# Patient Record
Sex: Female | Born: 1975 | Race: White | Hispanic: No | Smoking: Current every day smoker
Health system: Southern US, Community
[De-identification: ages and names within clinical notes are randomized; demographics above are authoritative.]

## PROBLEM LIST (undated history)

## (undated) DIAGNOSIS — D649 Anemia, unspecified: Secondary | ICD-10-CM

## (undated) DIAGNOSIS — F329 Major depressive disorder, single episode, unspecified: Secondary | ICD-10-CM

## (undated) DIAGNOSIS — M1990 Unspecified osteoarthritis, unspecified site: Secondary | ICD-10-CM

## (undated) DIAGNOSIS — IMO0002 Reserved for concepts with insufficient information to code with codable children: Secondary | ICD-10-CM

## (undated) DIAGNOSIS — K635 Polyp of colon: Secondary | ICD-10-CM

## (undated) DIAGNOSIS — K589 Irritable bowel syndrome without diarrhea: Secondary | ICD-10-CM

## (undated) DIAGNOSIS — N83209 Unspecified ovarian cyst, unspecified side: Secondary | ICD-10-CM

## (undated) DIAGNOSIS — M329 Systemic lupus erythematosus, unspecified: Secondary | ICD-10-CM

## (undated) DIAGNOSIS — F32A Depression, unspecified: Secondary | ICD-10-CM

## (undated) DIAGNOSIS — F419 Anxiety disorder, unspecified: Secondary | ICD-10-CM

## (undated) HISTORY — DX: Depression, unspecified: F32.A

## (undated) HISTORY — DX: Major depressive disorder, single episode, unspecified: F32.9

## (undated) HISTORY — PX: COLONOSCOPY: SHX174

## (undated) HISTORY — DX: Irritable bowel syndrome, unspecified: K58.9

## (undated) HISTORY — DX: Reserved for concepts with insufficient information to code with codable children: IMO0002

## (undated) HISTORY — DX: Anxiety disorder, unspecified: F41.9

## (undated) HISTORY — DX: Polyp of colon: K63.5

## (undated) HISTORY — DX: Anemia, unspecified: D64.9

## (undated) HISTORY — DX: Unspecified osteoarthritis, unspecified site: M19.90

## (undated) HISTORY — DX: Systemic lupus erythematosus, unspecified: M32.9

## (undated) HISTORY — DX: Unspecified ovarian cyst, unspecified side: N83.209

---

## 1993-12-22 HISTORY — PX: DILATION AND CURETTAGE OF UTERUS: SHX78

## 2005-07-26 ENCOUNTER — Emergency Department (HOSPITAL_COMMUNITY): Admission: EM | Admit: 2005-07-26 | Discharge: 2005-07-26 | Payer: Self-pay | Admitting: Emergency Medicine

## 2006-04-22 ENCOUNTER — Emergency Department: Payer: Self-pay | Admitting: Emergency Medicine

## 2006-11-01 ENCOUNTER — Emergency Department: Payer: Self-pay | Admitting: Unknown Physician Specialty

## 2008-01-02 ENCOUNTER — Inpatient Hospital Stay (HOSPITAL_COMMUNITY): Admission: AD | Admit: 2008-01-02 | Discharge: 2008-01-03 | Payer: Self-pay | Admitting: Obstetrics and Gynecology

## 2008-01-05 ENCOUNTER — Encounter (HOSPITAL_COMMUNITY): Admission: RE | Admit: 2008-01-05 | Discharge: 2008-02-04 | Payer: Self-pay | Admitting: Obstetrics and Gynecology

## 2008-02-05 ENCOUNTER — Encounter: Admission: RE | Admit: 2008-02-05 | Discharge: 2008-02-15 | Payer: Self-pay | Admitting: Obstetrics and Gynecology

## 2008-03-26 ENCOUNTER — Emergency Department: Payer: Self-pay | Admitting: Internal Medicine

## 2010-07-15 ENCOUNTER — Emergency Department: Payer: Self-pay | Admitting: Internal Medicine

## 2011-09-11 LAB — CBC
HCT: 31.3 — ABNORMAL LOW
HCT: 33 — ABNORMAL LOW
Hemoglobin: 11.6 — ABNORMAL LOW
MCHC: 34.9
MCV: 90.2
RBC: 3.47 — ABNORMAL LOW
RBC: 3.71 — ABNORMAL LOW
WBC: 13.1 — ABNORMAL HIGH

## 2011-11-22 ENCOUNTER — Encounter: Payer: Self-pay | Admitting: *Deleted

## 2011-11-22 ENCOUNTER — Emergency Department (HOSPITAL_COMMUNITY)
Admission: EM | Admit: 2011-11-22 | Discharge: 2011-11-22 | Disposition: A | Discharge status: Home / Self Care | Payer: Self-pay | Source: Home / Self Care

## 2011-11-22 DIAGNOSIS — B349 Viral infection, unspecified: Secondary | ICD-10-CM

## 2011-11-22 DIAGNOSIS — H6692 Otitis media, unspecified, left ear: Secondary | ICD-10-CM

## 2011-11-22 DIAGNOSIS — H6690 Otitis media, unspecified, unspecified ear: Secondary | ICD-10-CM

## 2011-11-22 DIAGNOSIS — B9789 Other viral agents as the cause of diseases classified elsewhere: Secondary | ICD-10-CM

## 2011-11-22 MED ORDER — AMOXICILLIN 500 MG PO CAPS: ORAL_CAPSULE | ORAL | Status: DC

## 2011-11-22 MED ORDER — PROMETHAZINE-CODEINE 6.25-10 MG/5ML PO SYRP: ORAL_SOLUTION | ORAL | Status: AC

## 2011-11-22 NOTE — ED Notes (Signed)
Pt called in lobby x 1 

## 2011-11-22 NOTE — ED Provider Notes (Signed)
Medical screening examination/treatment/procedure(s) were performed by non-physician practitioner and as supervising physician I was immediately available for consultation/collaboration.  LANEY,RONNIE   Ronnie Laney, MD 11/22/11 2222 

## 2011-11-22 NOTE — ED Provider Notes (Signed)
History     CSN: [id number] Arrival date & time: 11/22/2011  5:39 PM   None     Chief Complaint  Patient presents with  . Nasal Congestion    onset of symptoms x 3 days - last night fever 103 - pain with coughing bilateral upper abd   . Cough  . Otalgia  . Generalized Body Aches    (Consider location/radiation/quality/duration/timing/severity/associated sxs/prior treatment) HPI Comments: Onset of cough, fever, and body aches 3 days ago. Has been taking Robitussin without improvement. Pain lower ribs/upper abdomen area with cough and deep breath. Lt ear began hurting today. Has not taken anything for fever or pain.   Patient is a 35 y.o. female presenting with cough and ear pain. The history is provided by the patient.  Cough This is a new problem. The current episode started more than 2 days ago. The problem occurs every few minutes. The problem has not changed since onset.The cough is productive of sputum. The maximum temperature recorded prior to her arrival was 103 to 104 F. Associated symptoms include chest pain (bilat lower ribs with cough and deep breath), chills, ear pain, rhinorrhea, sore throat and myalgias. Pertinent negatives include no headaches, no shortness of breath and no wheezing. She has tried cough syrup for the symptoms. The treatment provided no relief. Her past medical history does not include asthma.  Otalgia This is a new problem. The current episode started 12 to 24 hours ago. There is pain in the left ear. The problem occurs constantly. The problem has not changed since onset.The pain is moderate. Associated symptoms include rhinorrhea, sore throat and cough. Pertinent negatives include no headaches. Her past medical history does not include chronic ear infection.    History reviewed. No pertinent past medical history.  History reviewed. No pertinent past surgical history.  History reviewed. No pertinent family history.  History  Substance Use Topics  .  Smoking status: Current Everyday Smoker  . Smokeless tobacco: Not on file  . Alcohol Use: Yes    OB History    Grav Para Term Preterm Abortions TAB SAB Ect Mult Living                  Review of Systems  Constitutional: Positive for chills.  HENT: Positive for ear pain, sore throat and rhinorrhea.   Respiratory: Positive for cough. Negative for shortness of breath and wheezing.   Cardiovascular: Positive for chest pain (bilat lower ribs with cough and deep breath).  Musculoskeletal: Positive for myalgias.  Neurological: Negative for headaches.    Allergies  Review of patient's allergies indicates no known allergies.  Home Medications   Current Outpatient Rx  Name Route Sig Dispense Refill  . GUAIFENESIN 100 MG/5ML PO LIQD Oral Take 200 mg by mouth 3 (three) times daily as needed.      . AMOXICILLIN 500 MG PO CAPS  1 cap tid x 10d 30 capsule 0  . PROMETHAZINE-CODEINE 6.25-10 MG/5ML PO SYRP  1-2 tsp every 6 hrs prn cough 120 mL 0    BP 103/70  Pulse 97  Temp(Src) 99.3 F (37.4 C) (Oral)  Resp 18  SpO2 97%  LMP 10/15/2011  Physical Exam  Nursing note and vitals reviewed. Constitutional: She appears well-developed and well-nourished. No distress.  HENT:  Head: Normocephalic and atraumatic.  Right Ear: Tympanic membrane, external ear and ear canal normal.  Left Ear: External ear and ear canal normal. Tympanic membrane is erythematous (and dull).  Nose: Nose normal.  Mouth/Throat: Uvula is midline, oropharynx is clear and moist and mucous membranes are normal. No oropharyngeal exudate, posterior oropharyngeal edema or posterior oropharyngeal erythema.  Neck: Neck supple.  Cardiovascular: Normal rate, regular rhythm and normal heart sounds.   Pulmonary/Chest: Effort normal and breath sounds normal. No respiratory distress.  Lymphadenopathy:    She has no cervical adenopathy.  Neurological: She is alert.  Skin: Skin is warm and dry.  Psychiatric: She has a normal mood  and affect.    ED Course  Procedures (including critical care time)   Labs Reviewed  POCT PREGNANCY, URINE   No results found.   1. Acute otitis media, left   2. Viral infection       MDM          Melody Comas, PA 11/22/11 1813

## 2015-03-29 ENCOUNTER — Other Ambulatory Visit: Payer: Self-pay

## 2015-03-29 ENCOUNTER — Other Ambulatory Visit: Payer: Self-pay | Admitting: Obstetrics & Gynecology

## 2015-03-29 ENCOUNTER — Other Ambulatory Visit (HOSPITAL_COMMUNITY)
Admission: RE | Admit: 2015-03-29 | Discharge: 2015-03-29 | Disposition: A | Discharge status: Home / Self Care | Payer: Managed Care, Other (non HMO) | Source: Ambulatory Visit | Attending: Obstetrics & Gynecology | Admitting: Obstetrics & Gynecology

## 2015-03-29 DIAGNOSIS — Z1151 Encounter for screening for human papillomavirus (HPV): Secondary | ICD-10-CM | POA: Insufficient documentation

## 2015-03-29 DIAGNOSIS — Z113 Encounter for screening for infections with a predominantly sexual mode of transmission: Secondary | ICD-10-CM | POA: Insufficient documentation

## 2015-03-29 DIAGNOSIS — Z01419 Encounter for gynecological examination (general) (routine) without abnormal findings: Secondary | ICD-10-CM | POA: Diagnosis not present

## 2015-03-29 DIAGNOSIS — Z1231 Encounter for screening mammogram for malignant neoplasm of breast: Secondary | ICD-10-CM

## 2015-03-30 LAB — CYTOLOGY - PAP

## 2015-04-04 ENCOUNTER — Ambulatory Visit
Admission: RE | Admit: 2015-04-04 | Discharge: 2015-04-04 | Disposition: A | Discharge status: Home / Self Care | Payer: Managed Care, Other (non HMO) | Source: Ambulatory Visit

## 2015-04-04 DIAGNOSIS — Z1231 Encounter for screening mammogram for malignant neoplasm of breast: Secondary | ICD-10-CM

## 2015-05-25 ENCOUNTER — Ambulatory Visit (INDEPENDENT_AMBULATORY_CARE_PROVIDER_SITE_OTHER): Payer: Managed Care, Other (non HMO) | Admitting: Medical

## 2015-05-25 ENCOUNTER — Encounter: Payer: Self-pay | Admitting: Medical

## 2015-05-25 ENCOUNTER — Ambulatory Visit (HOSPITAL_BASED_OUTPATIENT_CLINIC_OR_DEPARTMENT_OTHER)
Admission: RE | Admit: 2015-05-25 | Discharge: 2015-05-25 | Disposition: A | Discharge status: Home / Self Care | Payer: Managed Care, Other (non HMO) | Source: Ambulatory Visit | Attending: Medical | Admitting: Medical

## 2015-05-25 VITALS — BP 105/74 | HR 87 | Temp 98.9°F | Ht 69.0 in | Wt 169.0 lb

## 2015-05-25 DIAGNOSIS — D649 Anemia, unspecified: Secondary | ICD-10-CM

## 2015-05-25 DIAGNOSIS — S0093XA Contusion of unspecified part of head, initial encounter: Secondary | ICD-10-CM

## 2015-05-25 DIAGNOSIS — M898X1 Other specified disorders of bone, shoulder: Secondary | ICD-10-CM

## 2015-05-25 DIAGNOSIS — R0781 Pleurodynia: Secondary | ICD-10-CM | POA: Insufficient documentation

## 2015-05-25 DIAGNOSIS — J3489 Other specified disorders of nose and nasal sinuses: Secondary | ICD-10-CM | POA: Insufficient documentation

## 2015-05-25 DIAGNOSIS — M25512 Pain in left shoulder: Secondary | ICD-10-CM | POA: Diagnosis present

## 2015-05-25 DIAGNOSIS — R0789 Other chest pain: Secondary | ICD-10-CM

## 2015-05-25 LAB — CBC WITH DIFFERENTIAL/PLATELET
BASOS PCT: 0.7 % (ref 0.0–3.0)
Basophils Absolute: 0 10*3/uL (ref 0.0–0.1)
EOS PCT: 3.6 % (ref 0.0–5.0)
Eosinophils Absolute: 0.1 10*3/uL (ref 0.0–0.7)
HEMATOCRIT: 37.4 % (ref 36.0–46.0)
Hemoglobin: 12.6 g/dL (ref 12.0–15.0)
LYMPHS ABS: 1.6 10*3/uL (ref 0.7–4.0)
LYMPHS PCT: 41.6 % (ref 12.0–46.0)
MCHC: 33.5 g/dL (ref 30.0–36.0)
MCV: 91.2 fl (ref 78.0–100.0)
Monocytes Absolute: 0.3 10*3/uL (ref 0.1–1.0)
Monocytes Relative: 7 % (ref 3.0–12.0)
NEUTROS ABS: 1.8 10*3/uL (ref 1.4–7.7)
Neutrophils Relative %: 47.1 % (ref 43.0–77.0)
PLATELETS: 162 10*3/uL (ref 150.0–400.0)
RBC: 4.1 Mil/uL (ref 3.87–5.11)
RDW: 13.7 % (ref 11.5–15.5)
WBC: 3.9 10*3/uL — ABNORMAL LOW (ref 4.0–10.5)

## 2015-05-25 MED ORDER — DICLOFENAC SODIUM 75 MG PO TBEC: 75.0000 mg | DELAYED_RELEASE_TABLET | Freq: Two times a day (BID) | ORAL | Status: DC

## 2015-05-25 MED ORDER — CYCLOBENZAPRINE HCL 10 MG PO TABS: 10.0000 mg | ORAL_TABLET | Freq: Every day | ORAL | Status: DC
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
## 2015-05-25 NOTE — Progress Notes (Signed)
Pre visit review using our clinic review tool, if applicable. No additional management support is needed unless otherwise documented below in the visit note. 
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
## 2015-05-25 NOTE — Assessment & Plan Note (Signed)
Some pain behind rt ear. Mild and no loc during attack. I do not think imaging of head indicated but if you get neurologic signs or symptoms later then ED evaluation.

## 2015-05-25 NOTE — Assessment & Plan Note (Addendum)
Will get cxr and lt rib series. rx diclofenac and flexeril.

## 2015-05-26 ENCOUNTER — Encounter: Payer: Self-pay | Admitting: Medical

## 2015-05-28 ENCOUNTER — Telehealth: Payer: Self-pay | Admitting: Medical

## 2015-05-28 NOTE — Telephone Encounter (Signed)
Called patient with lab results.  

## 2015-05-28 NOTE — Telephone Encounter (Signed)
Caller name:Ovie Tigert Relationship to patient:self Can be reached:[phone]   Reason for call: Pt calling in reference to results from blood work and xrays on Friday- has sent a message through my chart on 05/26/15 as well.

## 2015-10-16 ENCOUNTER — Encounter: Payer: Self-pay | Admitting: Physician Assistant

## 2015-10-16 ENCOUNTER — Ambulatory Visit (INDEPENDENT_AMBULATORY_CARE_PROVIDER_SITE_OTHER): Payer: Managed Care, Other (non HMO) | Admitting: Physician Assistant

## 2015-10-16 ENCOUNTER — Ambulatory Visit (HOSPITAL_BASED_OUTPATIENT_CLINIC_OR_DEPARTMENT_OTHER)
Admission: RE | Admit: 2015-10-16 | Discharge: 2015-10-16 | Disposition: A | Discharge status: Home / Self Care | Payer: Managed Care, Other (non HMO) | Source: Ambulatory Visit | Attending: Physician Assistant | Admitting: Physician Assistant

## 2015-10-16 VITALS — BP 120/76 | HR 55 | Temp 98.3°F | Resp 16 | Ht 69.0 in | Wt 170.0 lb

## 2015-10-16 DIAGNOSIS — K2990 Gastroduodenitis, unspecified, without bleeding: Secondary | ICD-10-CM | POA: Diagnosis not present

## 2015-10-16 DIAGNOSIS — N83202 Unspecified ovarian cyst, left side: Secondary | ICD-10-CM | POA: Insufficient documentation

## 2015-10-16 DIAGNOSIS — R109 Unspecified abdominal pain: Secondary | ICD-10-CM | POA: Insufficient documentation

## 2015-10-16 DIAGNOSIS — K2970 Gastritis, unspecified, without bleeding: Secondary | ICD-10-CM | POA: Diagnosis not present

## 2015-10-16 LAB — POCT URINALYSIS DIPSTICK
BILIRUBIN UA: NEGATIVE
Blood, UA: NEGATIVE
GLUCOSE UA: NEGATIVE
KETONES UA: NEGATIVE
Leukocytes, UA: NEGATIVE
Nitrite, UA: NEGATIVE
Protein, UA: NEGATIVE
SPEC GRAV UA: 1.015
Urobilinogen, UA: 0.2
pH, UA: 6.5

## 2015-10-16 LAB — POCT URINE PREGNANCY: Preg Test, Ur: NEGATIVE

## 2015-10-16 MED ORDER — IOHEXOL 300 MG/ML  SOLN
100.0000 mL | Freq: Once | INTRAMUSCULAR | Status: AC | PRN
  Administered 2015-10-16: 100 mL via INTRAVENOUS

## 2015-10-16 MED ORDER — OMEPRAZOLE 20 MG PO CPDR: 20.0000 mg | DELAYED_RELEASE_CAPSULE | Freq: Two times a day (BID) | ORAL | Status: DC

## 2015-10-16 NOTE — Progress Notes (Signed)
Pre visit review using our clinic review tool, if applicable. No additional management support is needed unless otherwise documented below in the visit note/SLS  

## 2015-10-16 NOTE — Assessment & Plan Note (Signed)
Urine dip and urine pregnancy negative. Suspect gastritis due to recent increase in alcohol and NSAID use. Denies symptoms of rectal bleeding. Will begin Prilosec 20 mg BID. Will check CBC, CMP, Lipase, H. Pylori and CT abdomen. No alcohol or NSAIDs. Giving chronicity of symptoms prior to exacerbation, referral to GI placed for assessment and endoscopy/colonoscopy.

## 2015-10-16 NOTE — Patient Instructions (Signed)
Please go to the lab for blood work. Then stop by front desk to schedule CT scan with Marj.  Take the Prilosec twice daily as directed. Stay hydrated and follow diet recommendations below. No anti-inflammatories or alcohol. We will alter regimen based on results.

## 2015-10-16 NOTE — Progress Notes (Signed)
Patient presents to clinic today c/o 2 weeks of exacerbation of chronic LUQ pain associated with emesis and nausea. Denies fever, chills. Endorses diarrhea but denies melena or hematochezia. Denies recent travel or sick contact. Denies radiation of pain. Has had negative workup for chronic LUQ pain prior with imaging and labs. Was told she needed endoscopy. Does not increased alcohol consumption and daily NSAID use.  Past Medical History  Diagnosis Date  . Anemia   . Anxiety     No current outpatient prescriptions on file prior to visit.   No current facility-administered medications on file prior to visit.    No Known Allergies  Family History  Problem Relation Age of Onset  . Thrombosis Father     Social History   Social History  . Marital Status: Divorced    Spouse Name: N/A  . Number of Children: N/A  . Years of Education: N/A   Social History Main Topics  . Smoking status: Current Every Day Smoker  . Smokeless tobacco: None  . Alcohol Use: Yes  . Drug Use: No  . Sexual Activity: Not Asked   Other Topics Concern  . None   Social History Narrative   Review of Systems - See HPI.  All other ROS are negative.  BP 120/76 mmHg  Pulse 55  Temp(Src) 98.3 F (36.8 C) (Oral)  Resp 16  Ht 5\' 9"  (1.753 m)  Wt 170 lb (77.111 kg)  BMI 25.09 kg/m2  SpO2 100%  LMP 09/25/2015  Physical Exam  Constitutional: She is oriented to person, place, and time and well-developed, well-nourished, and in no distress.  HENT:  Head: Normocephalic and atraumatic.  Eyes: Conjunctivae are normal.  Neck: Neck supple.  Cardiovascular: Normal rate, regular rhythm, normal heart sounds and intact distal pulses.   Pulmonary/Chest: Effort normal and breath sounds normal. No respiratory distress. She has no wheezes. She has no rales. She exhibits no tenderness.  Abdominal: Soft. Normal appearance and bowel sounds are normal. There is no hepatosplenomegaly. There is tenderness in the left  upper quadrant. There is no rebound, no CVA tenderness, no tenderness at McBurney's point and negative Murphy's sign. No hernia.  Neurological: She is alert and oriented to person, place, and time.  Skin: Skin is warm and dry. No rash noted.  Psychiatric: Affect normal.  Vitals reviewed.  Recent Results (from the past 2160 hour(s))  POCT urinalysis dipstick     Status: None   Collection Time: 10/16/15  4:18 PM  Result Value Ref Range   Color, UA yellow    Clarity, UA clear    Glucose, UA neg    Bilirubin, UA neg    Ketones, UA neg    Spec Grav, UA 1.015    Blood, UA neg    pH, UA 6.5    Protein, UA neg    Urobilinogen, UA 0.2    Nitrite, UA neg    Leukocytes, UA Negative Negative  POCT urine pregnancy     Status: None   Collection Time: 10/16/15  4:20 PM  Result Value Ref Range   Preg Test, Ur Negative Negative    Assessment/Plan: Abdominal pain in female Urine dip and urine pregnancy negative. Suspect gastritis due to recent increase in alcohol and NSAID use. Denies symptoms of rectal bleeding. Will begin Prilosec 20 mg BID. Will check CBC, CMP, Lipase, H. Pylori and CT abdomen. No alcohol or NSAIDs. Giving chronicity of symptoms prior to exacerbation, referral to GI placed for assessment and endoscopy/colonoscopy.

## 2015-10-17 ENCOUNTER — Encounter: Payer: Self-pay | Admitting: Physician Assistant

## 2015-10-17 LAB — CBC WITH DIFFERENTIAL/PLATELET
BASOS PCT: 1.2 % (ref 0.0–3.0)
Basophils Absolute: 0.1 10*3/uL (ref 0.0–0.1)
EOS PCT: 3.9 % (ref 0.0–5.0)
Eosinophils Absolute: 0.2 10*3/uL (ref 0.0–0.7)
HEMATOCRIT: 33.7 % — AB (ref 36.0–46.0)
HEMOGLOBIN: 11.3 g/dL — AB (ref 12.0–15.0)
LYMPHS PCT: 42.7 % (ref 12.0–46.0)
Lymphs Abs: 1.9 10*3/uL (ref 0.7–4.0)
MCHC: 33.7 g/dL (ref 30.0–36.0)
MCV: 92.4 fl (ref 78.0–100.0)
MONOS PCT: 4.1 % (ref 3.0–12.0)
Monocytes Absolute: 0.2 10*3/uL (ref 0.1–1.0)
Neutro Abs: 2.1 10*3/uL (ref 1.4–7.7)
Neutrophils Relative %: 48.1 % (ref 43.0–77.0)
Platelets: 167 10*3/uL (ref 150.0–400.0)
RBC: 3.65 Mil/uL — AB (ref 3.87–5.11)
RDW: 13.7 % (ref 11.5–15.5)
WBC: 4.4 10*3/uL (ref 4.0–10.5)

## 2015-10-17 LAB — COMPREHENSIVE METABOLIC PANEL
ALBUMIN: 3.7 g/dL (ref 3.5–5.2)
ALT: 17 U/L (ref 0–35)
AST: 14 U/L (ref 0–37)
Alkaline Phosphatase: 64 U/L (ref 39–117)
BUN: 9 mg/dL (ref 6–23)
CALCIUM: 8.9 mg/dL (ref 8.4–10.5)
CHLORIDE: 108 meq/L (ref 96–112)
CO2: 27 meq/L (ref 19–32)
Creatinine, Ser: 0.72 mg/dL (ref 0.40–1.20)
GFR: 94.61 mL/min (ref >60.00)
Glucose, Bld: 94 mg/dL (ref 70–99)
POTASSIUM: 3.8 meq/L (ref 3.5–5.1)
Sodium: 142 mEq/L (ref 135–145)
Total Bilirubin: 0.3 mg/dL (ref 0.2–1.2)
Total Protein: 6.7 g/dL (ref 6.0–8.3)

## 2015-10-17 LAB — LIPASE: LIPASE: 36 U/L (ref 11.0–59.0)

## 2015-10-17 LAB — H. PYLORI ANTIBODY, IGG: H PYLORI IGG: NEGATIVE

## 2015-11-07 ENCOUNTER — Ambulatory Visit (INDEPENDENT_AMBULATORY_CARE_PROVIDER_SITE_OTHER): Payer: Managed Care, Other (non HMO) | Admitting: Physician Assistant

## 2015-11-07 ENCOUNTER — Other Ambulatory Visit (INDEPENDENT_AMBULATORY_CARE_PROVIDER_SITE_OTHER): Payer: Managed Care, Other (non HMO)

## 2015-11-07 ENCOUNTER — Encounter: Payer: Self-pay | Admitting: Physician Assistant

## 2015-11-07 ENCOUNTER — Other Ambulatory Visit: Payer: Self-pay | Admitting: *Deleted

## 2015-11-07 VITALS — BP 108/66 | HR 70 | Ht 69.0 in | Wt 168.0 lb

## 2015-11-07 DIAGNOSIS — R1032 Left lower quadrant pain: Secondary | ICD-10-CM | POA: Diagnosis not present

## 2015-11-07 DIAGNOSIS — E538 Deficiency of other specified B group vitamins: Secondary | ICD-10-CM

## 2015-11-07 DIAGNOSIS — R197 Diarrhea, unspecified: Secondary | ICD-10-CM

## 2015-11-07 DIAGNOSIS — R1012 Left upper quadrant pain: Secondary | ICD-10-CM | POA: Diagnosis not present

## 2015-11-07 DIAGNOSIS — R195 Other fecal abnormalities: Secondary | ICD-10-CM

## 2015-11-07 LAB — IBC PANEL
Iron: 79 ug/dL (ref 42–145)
SATURATION RATIOS: 23.4 % (ref 20.0–50.0)
TRANSFERRIN: 241 mg/dL (ref 212.0–360.0)

## 2015-11-07 LAB — HIGH SENSITIVITY CRP: CRP, High Sensitivity: 1.19 mg/L (ref 0.000–5.000)

## 2015-11-07 LAB — IGA: IgA: 490 mg/dL — ABNORMAL HIGH (ref 68–378)

## 2015-11-07 LAB — VITAMIN B12: Vitamin B-12: 170 pg/mL — ABNORMAL LOW (ref 211–911)

## 2015-11-07 LAB — FERRITIN: FERRITIN: 37 ng/mL (ref 10.0–291.0)

## 2015-11-07 MED ORDER — NA SULFATE-K SULFATE-MG SULF 17.5-3.13-1.6 GM/177ML PO SOLN: 1.0000 | Freq: Once | ORAL | Status: DC

## 2015-11-07 NOTE — Progress Notes (Signed)
Patient ID: Kelsey Bates, female   DOB: [birth year], 39 y.o.   MRN: [id number]    HPI:  Kelsey Bates is a 39 y.o.   female  referred by Brunetta Jeans, PA-C for evaluation of abdominal pain. She has past medical history of anemia, anxiety, depression, and irritable bowel syndrome. 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  Patient states she has had abdominal pain for 6 or 7 years. Her pain is intermittent and comes and goes. It is not alleviated or exacerbated with ingestion of food. It is not alleviated nor exacerbated with defecation. She reports that if she drinks a lot of coffee she will develop gurgling in the upper abdomen and begin to have cramping. She states that 2 weeks ago everyone in her household had a "stomach virus". Her family all felt better in 3 days but her symptoms were so severe she had difficulty standing for 2 weeks. She gets intermittent epigastric pain. She reports that every time she eats, she instantly has to have a bowel movement. She initially tried eliminating seeds and meat from her diet but this provided no help. Recently she eliminated beans and dairy and feels this has provided some relief. In the past she used to have a bowel movement every 3 days. For the past year she has 4 or 5 watery bowel movements daily. She has no mucus with her stools and has not noticed bright red blood. Over the past year she has also been having menstrual irregularities. She reports that she was admitted to [facility] 4 years ago and diagnosed with colitis. At that time she was sent home from the emergency room and advised to have a colonoscopy. Unfortunately, she never followed up with GI due to fear of a colonoscopy. 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 It was during that time that she developed diarrhea. She was never hospitalized for her toxic shock syndrome but was treated by her  gynecologist. She denies a history of PID. She often uses Aleve or Advil for her pain but recently discontinued this about 2 weeks ago. She has no heartburn. She has rare nausea. She was recently started on omeprazole but states it doesn't help her cramping or diarrhea at all. She does have nocturnal stooling.  Further questioning reveals that she was diagnosed with lupus 2 years ago at the [facility]. She states she has never followed with any one due to her fear of doctors. She reports that she has a sister with irritable bowel syndrome, a brother with ulcers, and if first-degree cousin with Crohn's disease. She is not aware of a family history of colon cancer or colon polyps. At the onset of her symptoms she has lost some weight but states she has gained it back. She has no associated eye pain, oral ulcers, joint pain, or skin rashes.   Past Medical History  Diagnosis Date  . Anemia   . Anxiety   . Depression   . IBS (irritable bowel syndrome)     Past Surgical History  Procedure Laterality Date  . Dilation and curettage of uterus  1995   Family History  Problem Relation Age of Onset  . Thrombosis Father    Social History  Substance Use Topics  . Smoking status: Current Every Day Smoker -- 0.50 packs/day for 30 years    Types: Cigarettes  . Smokeless tobacco: None  .  Alcohol Use: 0.0 oz/week    0 Standard drinks or equivalent per week   Current Outpatient Prescriptions  Medication Sig Dispense Refill  . acetaminophen (TYLENOL) 650 MG CR tablet Take 1,300 mg by mouth daily.    Marland Kitchen omeprazole (PRILOSEC) 20 MG capsule Take 1 capsule (20 mg total) by mouth 2 (two) times daily before a meal. 60 capsule 1  . Na Sulfate-K Sulfate-Mg Sulf SOLN Take 1 kit by mouth once. 354 mL 0   No current facility-administered medications for this visit.   No Known Allergies   Review of Systems: Gen: Denies any fever, chills, sweats, anorexia, fatigue, weakness, malaise, weight loss, and  sleep disorder CV: Denies chest pain, angina, palpitations, syncope, orthopnea, PND, peripheral edema, and claudication. Resp: Denies dyspnea at rest, dyspnea with exercise, cough, sputum, wheezing, coughing up blood, and pleurisy. GI: Denies vomiting blood, jaundice, and fecal incontinence.   Denies dysphagia or odynophagia. GU : Denies urinary burning, blood in urine, urinary frequency, urinary hesitancy, nocturnal urination, and urinary incontinence. MS: Denies joint pain, limitation of movement, and swelling, stiffness, low back pain, extremity pain. Denies muscle weakness, cramps, atrophy.  Derm: Denies rash, itching, dry skin, hives, moles, warts, or unhealing ulcers.  Psych: Denies depression, anxiety, memory loss, suicidal ideation, hallucinations, paranoia, and confusion. Heme: Denies bruising, bleeding, and enlarged lymph nodes. Neuro:  Denies any headaches, dizziness, paresthesias. Endo:  Denies any problems with DM, thyroid, adrenal function  Studies: Ct Abdomen Pelvis W Contrast  10/17/2015  CLINICAL DATA:  Acute left-sided abdominal pain. EXAM: CT ABDOMEN AND PELVIS WITH CONTRAST TECHNIQUE: Multidetector CT imaging of the abdomen and pelvis was performed using the standard protocol following bolus administration of intravenous contrast. CONTRAST:  151m OMNIPAQUE IOHEXOL 300 MG/ML  SOLN COMPARISON:  None. FINDINGS: Visualized lung bases are unremarkable. No significant osseous abnormality is noted. No gallstones are noted. The liver, spleen and pancreas appear normal. Adrenal glands and kidneys appear normal. The appendix appears normal. There is no evidence of bowel obstruction. No abnormal fluid collection is noted. No hydronephrosis or renal obstruction is noted. Urinary bladder appears normal. Left ovary and uterus appear normal. 3.5 cm left ovarian cyst is noted. No significant adenopathy is noted. IMPRESSION: 3.5 cm left ovarian cyst. No other significant abnormality seen in the  abdomen or pelvis. Electronically Signed   By: JMarijo Conception M.D.   On: 10/17/2015 09:10   Labs: CBC on 10/16/2015 WBC 4.4, hemoglobin 11.3, hematocrit 33.7, platelets 167,000, MCV 92.4. H. pylori IgG negative Comprehensive metabolic panel revealed total bilirubin 0.3, alkaline phosphatase 64, AST 14, ALT 17.  Physical Exam: BP 108/66 mmHg  Pulse 70  Ht _0  (1.753 m)  Wt 168 lb (76.204 kg)  BMI 24.80 kg/m2  LMP 09/25/2015 Constitutional: Pleasant,well-developed, Caucasian female in no acute distress. HEENT: Normocephalic and atraumatic. Conjunctivae are normal. No scleral icterus. Neck supple. No thyromegaly Cardiovascular: Normal rate, regular rhythm.  Pulmonary/chest: Effort normal and breath sounds normal. No wheezing, rales or rhonchi. Abdominal: Soft, nondistended, tender to palpation left lower quadrant with no rebound or guarding Bowel sounds active throughout. There are no masses palpable. No hepatomegaly. Rectal: Scant brown stool, Hemoccult-positive. Extremities: no edema Lymphadenopathy: No cervical adenopathy noted. Neurological: Alert and oriented to person place and time. Skin: Skin is warm and dry. No rashes noted. Psychiatric: Normal mood and affect. Behavior is normal.  ASSESSMENT AND PLAN: 39year old female with a six-year history of abdominal pain that has become worse over the past year. Her pain  is associated with a change in bowel habits. Patient has been having 4 or 5 watery bowel movements daily for the past year. A stool culture, stool for ova and parasites, and stool C. difficile PCR will be obtained, along with a high sensitivity CRP, iron panel, B-12, IgA, and TTG. She has signed a medical release to obtain records from [facility] and [facility]. She will be scheduled for colonoscopy to evaluate for polyps, neoplasia, inflammatory bowel disease, etc.The risks, benefits, and alternatives to colonoscopy with possible biopsy and possible  polypectomy were discussed with the patient and they consent to proceed.  The procedure will be scheduled with Dr. Henrene Pastor. Further recommendations will be made pending the findings of the above.    Leontina Skidmore, Deloris Ping 11/07/2015, 7:26 PM  CC: Brunetta Jeans, PA-C

## 2015-11-07 NOTE — Patient Instructions (Signed)
You have been scheduled for a colonoscopy. Please follow written instructions given to you at your visit today.  Please pick up your prep supplies at the pharmacy within the next 1-3 days. If you use inhalers (even only as needed), please bring them with you on the day of your procedure. Your physician has requested that you go to www.startemmi.com and enter the access code given to you at your visit today. This web site gives a general overview about your procedure. However, you should still follow specific instructions given to you by our office regarding your preparation for the procedure.  Your physician has requested that you go to the basement for the lab work before leaving today.  We have sent the following medications to your pharmacy for you to pick up at your convenience. Suprep

## 2015-11-08 LAB — TISSUE TRANSGLUTAMINASE, IGA: TISSUE TRANSGLUTAMINASE AB, IGA: 1 U/mL (ref <4)

## 2015-11-08 NOTE — Progress Notes (Signed)
With initial assessment and plans as outlined 

## 2015-11-09 ENCOUNTER — Other Ambulatory Visit: Payer: Managed Care, Other (non HMO)

## 2015-11-09 DIAGNOSIS — R1012 Left upper quadrant pain: Secondary | ICD-10-CM

## 2015-11-09 DIAGNOSIS — R1032 Left lower quadrant pain: Secondary | ICD-10-CM

## 2015-11-09 DIAGNOSIS — R197 Diarrhea, unspecified: Secondary | ICD-10-CM

## 2015-11-10 LAB — C. DIFFICILE GDH AND TOXIN A/B
C. difficile GDH: NOT DETECTED
C. difficile Toxin A/B: NOT DETECTED

## 2015-11-13 LAB — OVA AND PARASITE EXAMINATION: OP: NONE SEEN

## 2015-11-13 LAB — STOOL CULTURE

## 2015-11-20 ENCOUNTER — Ambulatory Visit (AMBULATORY_SURGERY_CENTER): Payer: Managed Care, Other (non HMO) | Admitting: Internal Medicine

## 2015-11-20 ENCOUNTER — Encounter: Payer: Self-pay | Admitting: Internal Medicine

## 2015-11-20 VITALS — BP 110/71 | HR 48 | Temp 98.7°F | Resp 21 | Ht 69.0 in | Wt 168.0 lb

## 2015-11-20 DIAGNOSIS — R1032 Left lower quadrant pain: Secondary | ICD-10-CM | POA: Diagnosis not present

## 2015-11-20 DIAGNOSIS — R197 Diarrhea, unspecified: Secondary | ICD-10-CM

## 2015-11-20 DIAGNOSIS — D122 Benign neoplasm of ascending colon: Secondary | ICD-10-CM

## 2015-11-20 MED ORDER — SODIUM CHLORIDE 0.9 % IV SOLN: 500.0000 mL | INTRAVENOUS | Status: DC

## 2015-11-20 NOTE — Progress Notes (Signed)
Pt states she is having some abdominal pain at discharge.  States this is the same type of discomfort she had been having.

## 2015-11-20 NOTE — Patient Instructions (Signed)
YOU HAD AN ENDOSCOPIC PROCEDURE TODAY AT [facility]:   Refer to the procedure report that was given to you for any specific questions about what was found during the examination.  If the procedure report does not answer your questions, please call your gastroenterologist to clarify.  If you requested that your care partner not be given the details of your procedure findings, then the procedure report has been included in a sealed envelope for you to review at your convenience later.  YOU SHOULD EXPECT: Some feelings of bloating in the abdomen. Passage of more gas than usual.  Walking can help get rid of the air that was put into your GI tract during the procedure and reduce the bloating. If you had a lower endoscopy (such as a colonoscopy or flexible sigmoidoscopy) you may notice spotting of blood in your stool or on the toilet paper. If you underwent a bowel prep for your procedure, you may not have a normal bowel movement for a few days.  Please Note:  You might notice some irritation and congestion in your nose or some drainage.  This is from the oxygen used during your procedure.  There is no need for concern and it should clear up in a day or so.  SYMPTOMS TO REPORT IMMEDIATELY:   Following lower endoscopy (colonoscopy or flexible sigmoidoscopy):  Excessive amounts of blood in the stool  Significant tenderness or worsening of abdominal pains  Swelling of the abdomen that is new, acute  Fever of 100F or higher  For urgent or emergent issues, a gastroenterologist can be reached at any hour by calling [phone].  DIET: Your first meal following the procedure should be a small meal and then it is ok to progress to your normal diet. Heavy or fried foods are harder to digest and may make you feel nauseous or bloated.  Likewise, meals heavy in dairy and vegetables can increase bloating.  Drink plenty of fluids but you should avoid alcoholic beverages for 24 hours.  ACTIVITY:   You should plan to take it easy for the rest of today and you should NOT DRIVE or use heavy machinery until tomorrow (because of the sedation medicines used during the test).    FOLLOW UP: Our staff will call the number listed on your records the next business day following your procedure to check on you and address any questions or concerns that you may have regarding the information given to you following your procedure. If we do not reach you, we will leave a message.  However, if you are feeling well and you are not experiencing any problems, there is no need to return our call.  We will assume that you have returned to your regular daily activities without incident.  If any biopsies were taken you will be contacted by phone or by letter within the next 1-3 weeks.  Please call us at [phone] if you have not heard about the biopsies in 3 weeks.   SIGNATURES/CONFIDENTIALITY: You and/or your care partner have signed paperwork which will be entered into your electronic medical record.  These signatures attest to the fact that that the information above on your After Visit Summary has been reviewed and is understood.  Full responsibility of the confidentiality of this discharge information lies with you and/or your care-partner.  Await pathology  Please continue your normal medications, including Immodium as needed  Please read over handout about polyps  Please call DrMarland Kitchen Perry's office in the  next few days to set up a follow up appointment

## 2015-11-20 NOTE — Progress Notes (Signed)
Report to PACU, RN, vss, BBS= Clear.  

## 2015-11-20 NOTE — Progress Notes (Signed)
Called to room to assist during endoscopic procedure.  Patient ID and intended procedure confirmed with present staff. Received instructions for my participation in the procedure from the performing physician.  

## 2015-11-20 NOTE — Op Note (Signed)
[facility]  [address] [place], [address]   COLONOSCOPY PROCEDURE REPORT  PATIENT: Kelsey Bates, Kelsey Bates  MR#: [id number] BIRTHDATE: 03-Feb-1976 , 39  yrs. old GENDER: female ENDOSCOPIST: Eustace Quail, MD REFERRED [id number] Hassell Done, M.D. PROCEDURE DATE:  11/20/2015 PROCEDURE:   Colonoscopy with snare polypectomy x 1 and Colonoscopy with biopsy  ASA CLASS:   Class II INDICATIONS:chronic diarrhea and abdominal pain in the lower left quadrant. MEDICATIONS: Monitored anesthesia care and Propofol 260 mg IV  DESCRIPTION OF PROCEDURE:   After the risks benefits and alternatives of the procedure were thoroughly explained, informed consent was obtained.  The digital rectal exam revealed no abnormalities of the rectum.   The LB [id number]  endoscope was introduced through the anus and advanced to the cecum, which was identified by both the appendix and ileocecal valve. No adverse events experienced.   The quality of the prep was excellent. (Suprep was used)  The instrument was then slowly withdrawn as the colon was fully examined. Estimated blood loss is zero unless otherwise noted in this procedure report.  COLON FINDINGS: The examined terminal ileum appeared to be normal. A sessile polyp measuring 5 mm in size was found in the ascending colon.  A polypectomy was performed with a cold snare.  The resection was complete, the polyp tissue was completely retrieved and sent to histology.   The examination was otherwise normal. Multiple random colon biopsies taken.  Retroflexed views revealed no abnormalities. The time to cecum = 2.9 Withdrawal time = 10.8 The scope was withdrawn and the procedure completed. COMPLICATIONS: There were no immediate complications.  ENDOSCOPIC IMPRESSION: 1.   The examined terminal ileum appeared to be normal 2.   Sessile polyp was found in the ascending colon; polypectomy was performed with a cold snare 3.   The examination was  otherwise normal  RECOMMENDATIONS: 1.  Await biopsy results. Dr. Henrene Pastor will send you a letter with results 2.  Repeat colonoscopy in 5 years if polyp adenomatous/SSP; otherwise 10 years 3. Okay to try the antidiarrhea medicine Imodium as needed 4. Make a follow-up office appointment with Dr. Henrene Pastor for further management  eSigned:  Eustace Quail, MD 11/20/2015 9:10 AM   cc: The Patient

## 2015-11-21 ENCOUNTER — Telehealth: Payer: Self-pay | Admitting: *Deleted

## 2015-11-21 NOTE — Telephone Encounter (Signed)
Message left

## 2015-11-27 ENCOUNTER — Telehealth: Payer: Self-pay | Admitting: Internal Medicine

## 2015-11-27 ENCOUNTER — Encounter: Payer: Self-pay | Admitting: Internal Medicine

## 2015-11-27 NOTE — Telephone Encounter (Signed)
Rec;d from [facility] forward 4 pages to Dr.Perry

## 2015-12-12 ENCOUNTER — Telehealth: Payer: Self-pay | Admitting: Medical

## 2015-12-31 ENCOUNTER — Ambulatory Visit: Payer: Managed Care, Other (non HMO) | Admitting: Internal Medicine

## 2016-01-16 NOTE — Telephone Encounter (Signed)
flu

## 2016-02-21 NOTE — Telephone Encounter (Signed)
LM for pt to call and schedule flu shot or update records. °

## 2017-11-27 ENCOUNTER — Emergency Department (HOSPITAL_COMMUNITY)
Admission: EM | Admit: 2017-11-27 | Discharge: 2017-11-27 | Disposition: A | Discharge status: Home / Self Care | Payer: Self-pay | Attending: Emergency Medicine | Admitting: Emergency Medicine

## 2017-11-27 ENCOUNTER — Other Ambulatory Visit: Payer: Self-pay

## 2017-11-27 ENCOUNTER — Encounter (HOSPITAL_COMMUNITY): Payer: Self-pay

## 2017-11-27 ENCOUNTER — Emergency Department (HOSPITAL_COMMUNITY): Payer: Self-pay

## 2017-11-27 DIAGNOSIS — F17210 Nicotine dependence, cigarettes, uncomplicated: Secondary | ICD-10-CM | POA: Insufficient documentation

## 2017-11-27 DIAGNOSIS — Z79899 Other long term (current) drug therapy: Secondary | ICD-10-CM | POA: Insufficient documentation

## 2017-11-27 DIAGNOSIS — R002 Palpitations: Secondary | ICD-10-CM | POA: Insufficient documentation

## 2017-11-27 DIAGNOSIS — F419 Anxiety disorder, unspecified: Secondary | ICD-10-CM | POA: Insufficient documentation

## 2017-11-27 DIAGNOSIS — R0602 Shortness of breath: Secondary | ICD-10-CM | POA: Insufficient documentation

## 2017-11-27 DIAGNOSIS — R0789 Other chest pain: Secondary | ICD-10-CM | POA: Insufficient documentation

## 2017-11-27 LAB — I-STAT TROPONIN, ED
TROPONIN I, POC: 0 ng/mL (ref 0.00–0.08)
Troponin i, poc: 0 ng/mL (ref 0.00–0.08)

## 2017-11-27 LAB — CBC
HEMATOCRIT: 37 % (ref 36.0–46.0)
HEMOGLOBIN: 12.5 g/dL (ref 12.0–15.0)
MCH: 31.6 pg (ref 26.0–34.0)
MCHC: 33.8 g/dL (ref 30.0–36.0)
MCV: 93.7 fL (ref 78.0–100.0)
Platelets: 191 10*3/uL (ref 150–400)
RBC: 3.95 MIL/uL (ref 3.87–5.11)
RDW: 13 % (ref 11.5–15.5)
WBC: 4.8 10*3/uL (ref 4.0–10.5)

## 2017-11-27 LAB — I-STAT BETA HCG BLOOD, ED (MC, WL, AP ONLY): I-stat hCG, quantitative: <5 m[IU]/mL (ref <5)

## 2017-11-27 LAB — BASIC METABOLIC PANEL
ANION GAP: 9 (ref 5–15)
BUN: 13 mg/dL (ref 6–20)
CO2: 24 mmol/L (ref 22–32)
Calcium: 9.6 mg/dL (ref 8.9–10.3)
Chloride: 109 mmol/L (ref 101–111)
Creatinine, Ser: 0.8 mg/dL (ref 0.44–1.00)
GFR calc Af Amer: >60 mL/min (ref >60)
GLUCOSE: 95 mg/dL (ref 65–99)
POTASSIUM: 4.1 mmol/L (ref 3.5–5.1)
Sodium: 142 mmol/L (ref 135–145)

## 2017-11-27 LAB — D-DIMER, QUANTITATIVE (NOT AT ARMC)

## 2017-11-27 MED ORDER — SODIUM CHLORIDE 0.9 % IV BOLUS (SEPSIS)
1000.0000 mL | Freq: Once | INTRAVENOUS | Status: AC
  Administered 2017-11-27: 1000 mL via INTRAVENOUS

## 2017-11-27 NOTE — ED Provider Notes (Signed)
[facility] DEPT Provider Note   CSN: [id number] Arrival date & time: 11/27/17  1001     History   Chief Complaint Chief Complaint  Patient presents with  . Chest Pain  . Neck Pain    HPI Aunisty A Haber is a 41 y.o. female hx of anemia, anxiety, IBS, here presenting with palpitations, shortness of breath, chest pain.  Patient states that she has palpitations ongoing for the last several months. She states that palpitations are intermittent. Today, she was going to work and had sudden onset of left sided chest pain radiate to left side of her neck. Has some bilateral arm numbness that resolved. Pain free currently. Her mother has hx of alpha 1 antitrypsin and father had PE in the past. She has no recent travel or leg swelling or hx of DVT or PE or cardiac history.   The history is provided by the patient.    Past Medical History:  Diagnosis Date  . Anemia   . Anxiety   . Arthritis    R FINGERS AND WRIST  . Colon polyp    negative for dysplasia  . Depression   . IBS (irritable bowel syndrome)   . Lupus    2014  . Ovarian cyst    left    Patient Active Problem List   Diagnosis Date Noted  . Abdominal pain in female 10/16/2015  . Head contusion 05/25/2015  . Rib pain on left side 05/25/2015  . Anemia 05/25/2015    Past Surgical History:  Procedure Laterality Date  . COLONOSCOPY    . DILATION AND CURETTAGE OF UTERUS  1995    OB History    No data available       Home Medications    Prior to Admission medications   Medication Sig Start Date End Date Taking? Authorizing Provider  DM-Phenylephrine-Acetaminophen (VICKS DAYQUIL COLD & FLU) 10-5-325 MG CAPS Take 2 capsules by mouth once.   Yes [provider]  naproxen sodium (ALEVE) 220 MG tablet Take 440 mg by mouth every other day.   Yes [provider]    Family History Family History  Problem Relation Age of Onset  . Alpha-1 antitrypsin deficiency Mother     . Thrombosis Father     Social History Social History   Tobacco Use  . Smoking status: Current Every Day Smoker    Packs/day: 1.00    Years: 30.00    Pack years: 30.00    Types: Cigarettes  . Smokeless tobacco: Former Systems developer  . Tobacco comment: VAPOR-IN PAST  Substance Use Topics  . Alcohol use: Yes    Alcohol/week: 0.0 oz    Comment: 2-3 times a week  . Drug use: Yes    Comment: occasionally     Allergies   Patient has no known allergies.   Review of Systems Review of Systems  Cardiovascular: Positive for chest pain.  All other systems reviewed and are negative.    Physical Exam Updated Vital Signs BP 112/77   Pulse (!) 55   Temp 98.4 F (36.9 C) (Oral)   Resp (!) 25   Ht 5\' 9"  (1.753 m)   Wt 83.9 kg (185 lb)   SpO2 98%   BMI 27.32 kg/m   Physical Exam  Constitutional: She is oriented to person, place, and time. She appears well-developed and well-nourished.  HENT:  Head: Normocephalic.  Eyes: Pupils are equal, round, and reactive to light.  Neck: Normal range of motion.  No bruit, nl ROM   Cardiovascular: Normal rate, regular rhythm and normal pulses.  Pulmonary/Chest: Effort normal.  Abdominal: Soft. Bowel sounds are normal.  Musculoskeletal: Normal range of motion.       Right lower leg: Normal.       Left lower leg: Normal.  Neurological: She is alert and oriented to person, place, and time.  Skin: Skin is warm. Capillary refill takes less than 2 seconds.  Psychiatric: She has a normal mood and affect. Her behavior is normal.  Nursing note and vitals reviewed.    ED Treatments / Results  Labs (all labs ordered are listed, but only abnormal results are displayed) Labs Reviewed  BASIC METABOLIC PANEL  CBC  D-DIMER, QUANTITATIVE (NOT AT [facility])  I-STAT TROPONIN, ED  I-STAT BETA HCG BLOOD, ED ([facility], [facility], [facility] ONLY)  I-STAT TROPONIN, ED    EKG  EKG Interpretation  Date/Time:  Friday November 27 2017 10:09:41 EST Ventricular Rate:  84 PR  Interval:    QRS Duration: 93 QT Interval:  372 QTC Calculation: 440 R Axis:   72 Text Interpretation:  Sinus rhythm RSR' in V1 or V2, right VCD or RVH No significant change since last tracing Confirmed by Wandra Arthurs [phone]) on 11/27/2017 12:06:17 PM       Radiology Dg Chest 2 View  Result Date: 11/27/2017 CLINICAL DATA:  41 year old female with chest pain and palpitations for 3-4 days. Right side pain radiating to the arm and both legs. Left side pain radiating to the neck. EXAM: CHEST  2 VIEW COMPARISON:  06/21/2015 chest radiographs and earlier. FINDINGS: Lung volumes remain within normal limits. Normal cardiac size and mediastinal contours. Visualized tracheal air column is within normal limits. Lung parenchyma is stable and clear. No pneumothorax or pleural effusion. No acute osseous abnormality identified. Negative visible bowel gas pattern. IMPRESSION: Negative.  No acute cardiopulmonary abnormality. Electronically Signed   By: Genevie Ann M.D.   On: 11/27/2017 11:20    Procedures Procedures (including critical care time)  Medications Ordered in ED Medications  sodium chloride 0.9 % bolus 1,000 mL (1,000 mLs Intravenous New Bag/Given 11/27/17 1230)     Initial Impression / Assessment and Plan / ED Course  I have reviewed the triage vital signs and the nursing notes.  Pertinent labs & imaging results that were available during my care of the patient were reviewed by me and considered in my medical decision making (see chart for details).    Taccara A Severtson is a 41 y.o. female here with chest pain, SOB. Has family hx of PE but no personal hx of DVT/ PE or CAD. Atypical chest pain. I doubt ACS, low suspicion for PE. Will get trop x 2, d-dimer, labs, CXR.   1:42 PM Trop neg x 2, d-dimer negative. Stable for discharge. I think there is some anxiety associated with it. Recommend holter monitor if persistent palpitations.   Final Clinical Impressions(s) / ED Diagnoses   Final  diagnoses:  None    ED Discharge Orders    None       Drenda Freeze, MD 11/27/17 1343

## 2017-11-27 NOTE — Discharge Instructions (Signed)
See your doctor. Consider holter monitor if you have persistent palpitations.   Take tylenol, motrin for pain   Return to ER if you have worse chest pain, palpitations, trouble breathing, leg swelling

## 2017-11-27 NOTE — ED Triage Notes (Signed)
Patient c/o palpitations for a few days. Patient states she began having mid chest pain and pain above the right breast area since 0800 today. Patient states she began having left lateral neck pain near the collar bone at 0945. Patient states this pain has since subsided since coming to the ED. Patient reports SOB since having chest pain and neck pain.

## 2018-01-06 ENCOUNTER — Telehealth: Payer: Self-pay | Admitting: Medical

## 2018-01-06 NOTE — Telephone Encounter (Signed)
Left pt a message to call back. 

## 2018-01-06 NOTE — Telephone Encounter (Signed)
This summer will be three years since I saw pt. Saw was in the ED the other day/weeks ago. She never followed up. Since so long since I have seen her. Does she have new pcp? Is she every going to come back? Would you call her and see what is going on.   Let me know what she says.

## 2019-03-10 ENCOUNTER — Encounter: Payer: Self-pay | Admitting: Medical

## 2020-09-28 ENCOUNTER — Other Ambulatory Visit: Payer: Self-pay | Admitting: Medical

## 2020-09-28 ENCOUNTER — Other Ambulatory Visit: Payer: Self-pay

## 2020-09-28 DIAGNOSIS — Z1231 Encounter for screening mammogram for malignant neoplasm of breast: Secondary | ICD-10-CM

## 2020-10-09 ENCOUNTER — Other Ambulatory Visit: Payer: Self-pay

## 2020-10-09 ENCOUNTER — Emergency Department (HOSPITAL_BASED_OUTPATIENT_CLINIC_OR_DEPARTMENT_OTHER): Payer: 59

## 2020-10-09 ENCOUNTER — Encounter (HOSPITAL_BASED_OUTPATIENT_CLINIC_OR_DEPARTMENT_OTHER): Payer: Self-pay | Admitting: *Deleted

## 2020-10-09 ENCOUNTER — Emergency Department (HOSPITAL_BASED_OUTPATIENT_CLINIC_OR_DEPARTMENT_OTHER)
Admission: EM | Admit: 2020-10-09 | Discharge: 2020-10-09 | Disposition: A | Discharge status: Home / Self Care | Payer: 59 | Attending: Emergency Medicine | Admitting: Emergency Medicine

## 2020-10-09 DIAGNOSIS — F17210 Nicotine dependence, cigarettes, uncomplicated: Secondary | ICD-10-CM | POA: Insufficient documentation

## 2020-10-09 DIAGNOSIS — R109 Unspecified abdominal pain: Secondary | ICD-10-CM

## 2020-10-09 DIAGNOSIS — R079 Chest pain, unspecified: Secondary | ICD-10-CM | POA: Insufficient documentation

## 2020-10-09 DIAGNOSIS — R0789 Other chest pain: Secondary | ICD-10-CM

## 2020-10-09 LAB — PREGNANCY, URINE: Preg Test, Ur: NEGATIVE

## 2020-10-09 LAB — CBC
HCT: 40.9 % (ref 36.0–46.0)
Hemoglobin: 13.9 g/dL (ref 12.0–15.0)
MCH: 31 pg (ref 26.0–34.0)
MCHC: 34 g/dL (ref 30.0–36.0)
MCV: 91.1 fL (ref 80.0–100.0)
Platelets: 192 10*3/uL (ref 150–400)
RBC: 4.49 MIL/uL (ref 3.87–5.11)
RDW: 13.6 % (ref 11.5–15.5)
WBC: 7.7 10*3/uL (ref 4.0–10.5)
nRBC: 0 % (ref 0.0–0.2)

## 2020-10-09 LAB — BASIC METABOLIC PANEL
Anion gap: 11 (ref 5–15)
BUN: 17 mg/dL (ref 6–20)
CO2: 26 mmol/L (ref 22–32)
Calcium: 9.7 mg/dL (ref 8.9–10.3)
Chloride: 104 mmol/L (ref 98–111)
Creatinine, Ser: 0.96 mg/dL (ref 0.44–1.00)
GFR, Estimated: >60 mL/min (ref >60)
Glucose, Bld: 104 mg/dL — ABNORMAL HIGH (ref 70–99)
Potassium: 4.1 mmol/L (ref 3.5–5.1)
Sodium: 141 mmol/L (ref 135–145)

## 2020-10-09 LAB — TROPONIN I (HIGH SENSITIVITY): Troponin I (High Sensitivity): 10 ng/L (ref <18)

## 2020-10-09 MED ORDER — METHOCARBAMOL 500 MG PO TABS: 500.0000 mg | ORAL_TABLET | Freq: Four times a day (QID) | ORAL | 0 refills | Status: AC | PRN

## 2020-10-09 MED ORDER — OMEPRAZOLE 20 MG PO CPDR
20.0000 mg | DELAYED_RELEASE_CAPSULE | Freq: Every day | ORAL | 0 refills | Status: AC
Start: 2020-10-09 — End: ?

## 2020-10-09 NOTE — ED Provider Notes (Signed)
[facility] EMERGENCY DEPARTMENT Provider Note   CSN: [id number] Arrival date & time: 10/09/20  1728     History Chief Complaint  Patient presents with  . Chest Pain    Kelsey Bates is a 44 y.o. female.  HPI Patient reports she had headaches 2 days ago.  She reports it was ice pick and stabbing headaches in the temple area.  That resolved.  She reports today she had an episode whereby she felt pain in the center of her chest.  She reports she then felt that she was developing a panic attack.  She reports that she has had history of panic attacks and recognizes them.  She reports it was unusual however because she got, sharp uncomfortable pain at the base of her neck on the left side.  She has never had anything similar.  It has resolved.  She reports earlier when she was eating she perceived a swelling in the area but that is gone now.  Also, patient has been having pain in her mid lower back.  She attributes it to her kidneys.  She reports that historically there was some mention of a problem with her kidneys but she never had it fully evaluated.  She reports she gets some general aching in the low mid back.  It feels better to actually take her fist and likely percussed the area.  She reports she is careful to stay well-hydrated to protect her kidneys.  She reports however she has noted her urine to be slightly dark.  No pain burning urgency with urination.  No fevers.  No nausea no vomiting.  Patient has no history of hypertension.  No history of diabetes.  She reports family history is positive for DVT.    Past Medical History:  Diagnosis Date  . Anemia   . Anxiety   . Arthritis    R FINGERS AND WRIST  . Colon polyp    negative for dysplasia  . Depression   . IBS (irritable bowel syndrome)   . Lupus ([place])    2014  . Ovarian cyst    left    Patient Active Problem List   Diagnosis Date Noted  . Abdominal pain in female 10/16/2015  . Head contusion 05/25/2015  .  Rib pain on left side 05/25/2015  . Anemia 05/25/2015    Past Surgical History:  Procedure Laterality Date  . COLONOSCOPY    . DILATION AND CURETTAGE OF UTERUS  1995     OB History   No obstetric history on file.     Family History  Problem Relation Age of Onset  . Alpha-1 antitrypsin deficiency Mother   . Thrombosis Father     Social History   Tobacco Use  . Smoking status: Current Every Day Smoker    Packs/day: 1.00    Years: 30.00    Pack years: 30.00    Types: Cigarettes  . Smokeless tobacco: Former Systems developer  . Tobacco comment: VAPOR-IN PAST  Vaping Use  . Vaping Use: Never used  Substance Use Topics  . Alcohol use: Yes    Alcohol/week: 0.0 standard drinks    Comment: 2-3 times a week  . Drug use: Yes    Comment: occasionally    Home Medications Prior to Admission medications   Medication Sig Start Date End Date Taking? Authorizing Provider  DM-Phenylephrine-Acetaminophen (VICKS DAYQUIL COLD & FLU) 10-5-325 MG CAPS Take 2 capsules by mouth once.    [provider]  methocarbamol (ROBAXIN)  500 MG tablet Take 1 tablet (500 mg total) by mouth every 6 (six) hours as needed for muscle spasms. 10/09/20   Charlesetta Shanks, MD  naproxen sodium (ALEVE) 220 MG tablet Take 440 mg by mouth every other day.    [provider]  omeprazole (PRILOSEC) 20 MG capsule Take 1 capsule (20 mg total) by mouth daily. 10/09/20   Charlesetta Shanks, MD    Allergies    Patient has no known allergies.  Review of Systems   Review of Systems 10 systems reviewed and negative except as per HPI Physical Exam Updated Vital Signs BP 109/74   Pulse 67   Temp 98.1 F (36.7 C) (Oral)   Resp 19   Ht 5\' 9"  (1.753 m)   Wt 102.1 kg   SpO2 100%   BMI 33.23 kg/m   Physical Exam Constitutional:      Appearance: She is well-developed.  HENT:     Head: Normocephalic and atraumatic.  Eyes:     Extraocular Movements: Extraocular movements intact.     Conjunctiva/sclera:  Conjunctivae normal.     Pupils: Pupils are equal, round, and reactive to light.  Cardiovascular:     Rate and Rhythm: Normal rate and regular rhythm.     Heart sounds: Normal heart sounds.  Pulmonary:     Effort: Pulmonary effort is normal.     Breath sounds: Normal breath sounds.  Abdominal:     General: Bowel sounds are normal. There is no distension.     Palpations: Abdomen is soft.     Tenderness: There is no abdominal tenderness.     Comments: No CVA tenderness  Musculoskeletal:        General: Normal range of motion.     Cervical back: Neck supple. No tenderness.     Right lower leg: No edema.     Left lower leg: No edema.  Lymphadenopathy:     Cervical: No cervical adenopathy.  Skin:    General: Skin is warm and dry.  Neurological:     General: No focal deficit present.     Mental Status: She is alert and oriented to person, place, and time.     GCS: GCS eye subscore is 4. GCS verbal subscore is 5. GCS motor subscore is 6.     Coordination: Coordination normal.  Psychiatric:        Mood and Affect: Mood normal.     ED Results / Procedures / Treatments   Labs (all labs ordered are listed, but only abnormal results are displayed) Labs Reviewed  BASIC METABOLIC PANEL - Abnormal; Notable for the following components:      Result Value   Glucose, Bld 104 (*)    All other components within normal limits  CBC  PREGNANCY, URINE  TROPONIN I (HIGH SENSITIVITY)  TROPONIN I (HIGH SENSITIVITY)    EKG EKG Interpretation  Date/Time:  Tuesday October 09 2020 17:44:06 EDT Ventricular Rate:  77 PR Interval:  156 QRS Duration: 86 QT Interval:  404 QTC Calculation: 457 R Axis:   59 Text Interpretation: Normal sinus rhythm RSR' V1 V2 Abnormal ECG no sig change from previous Confirmed by Charlesetta Shanks [phone]) on 10/09/2020 9:27:46 PM   Radiology DG Chest 2 View  Result Date: 10/09/2020 CLINICAL DATA:  Right-sided chest pain. EXAM: CHEST - 2 VIEW COMPARISON:   November 27, 2017 FINDINGS: The heart size and mediastinal contours are within normal limits. Both lungs are clear. No acute osseous abnormalities are identified. IMPRESSION: No  active cardiopulmonary disease. Electronically Signed   By: Virgina Norfolk M.D.   On: 10/09/2020 18:11    Procedures Procedures (including critical care time)  Medications Ordered in ED Medications - No data to display  ED Course  I have reviewed the triage vital signs and the nursing notes.  Pertinent labs & imaging results that were available during my care of the patient were reviewed by me and considered in my medical decision making (see chart for details).    MDM Rules/Calculators/A&P                         Patient episode of chest pain earlier with associated symptoms she attributes to a panic attack.  What she, concerning was the development of an atypical pain at the base of her neck.  At this time, patient is clinically well in appearance.  Vital signs are normal.  No hypertension.  Heart is regular without tachycardia.  Chest x-ray normal.  Patient had concern for possible kidney problems.  GFR is normal.  We reviewed prior CT scans and there have been 2 normal CT scans since a nonspecific abnormality identified in 2013.  I have low suspicion for any chronic underlying kidney disease.  Patient is clinically well without any problems with peripheral edema, any evidence of cardiac disease on clinical exam or diagnostic studies done today.  At this time, patient stable for discharge.  Return precautions are reviewed.  Patient has scheduled follow-ups with PCP.  Is as she has been having some low back pain that seems improved with massage, will provide a prescription for Robaxin.  Also she has experienced some central chest pain and tonight some radiation to the neck.  We will have her trial empiric course of omeprazole.  Return precautions reviewed. Final Clinical Impression(s) / ED Diagnoses Final diagnoses:    Atypical chest pain  Flank pain    Rx / DC Orders ED Discharge Orders         Ordered    methocarbamol (ROBAXIN) 500 MG tablet  Every 6 hours PRN        10/09/20 2150    omeprazole (PRILOSEC) 20 MG capsule  Daily        10/09/20 2150           Charlesetta Shanks, MD 10/09/20 2157

## 2020-10-09 NOTE — ED Triage Notes (Signed)
Right sided chest pain and throbbing in the left side of her neck. Her left forearm has a deep dull pain. Headache x 2 days. States her father died of a blood clot and she is afraid she has the same. She appears tearful and anxious.

## 2020-10-24 ENCOUNTER — Ambulatory Visit: Payer: Self-pay

## 2020-12-13 IMAGING — CR DG CHEST 2V
2 series · 2 of 2 positions shown · non-contrast
Comparison: November 27, 2017

CLINICAL DATA: Right-sided chest pain.

EXAM:
CHEST - 2 VIEW

[w chest pa]
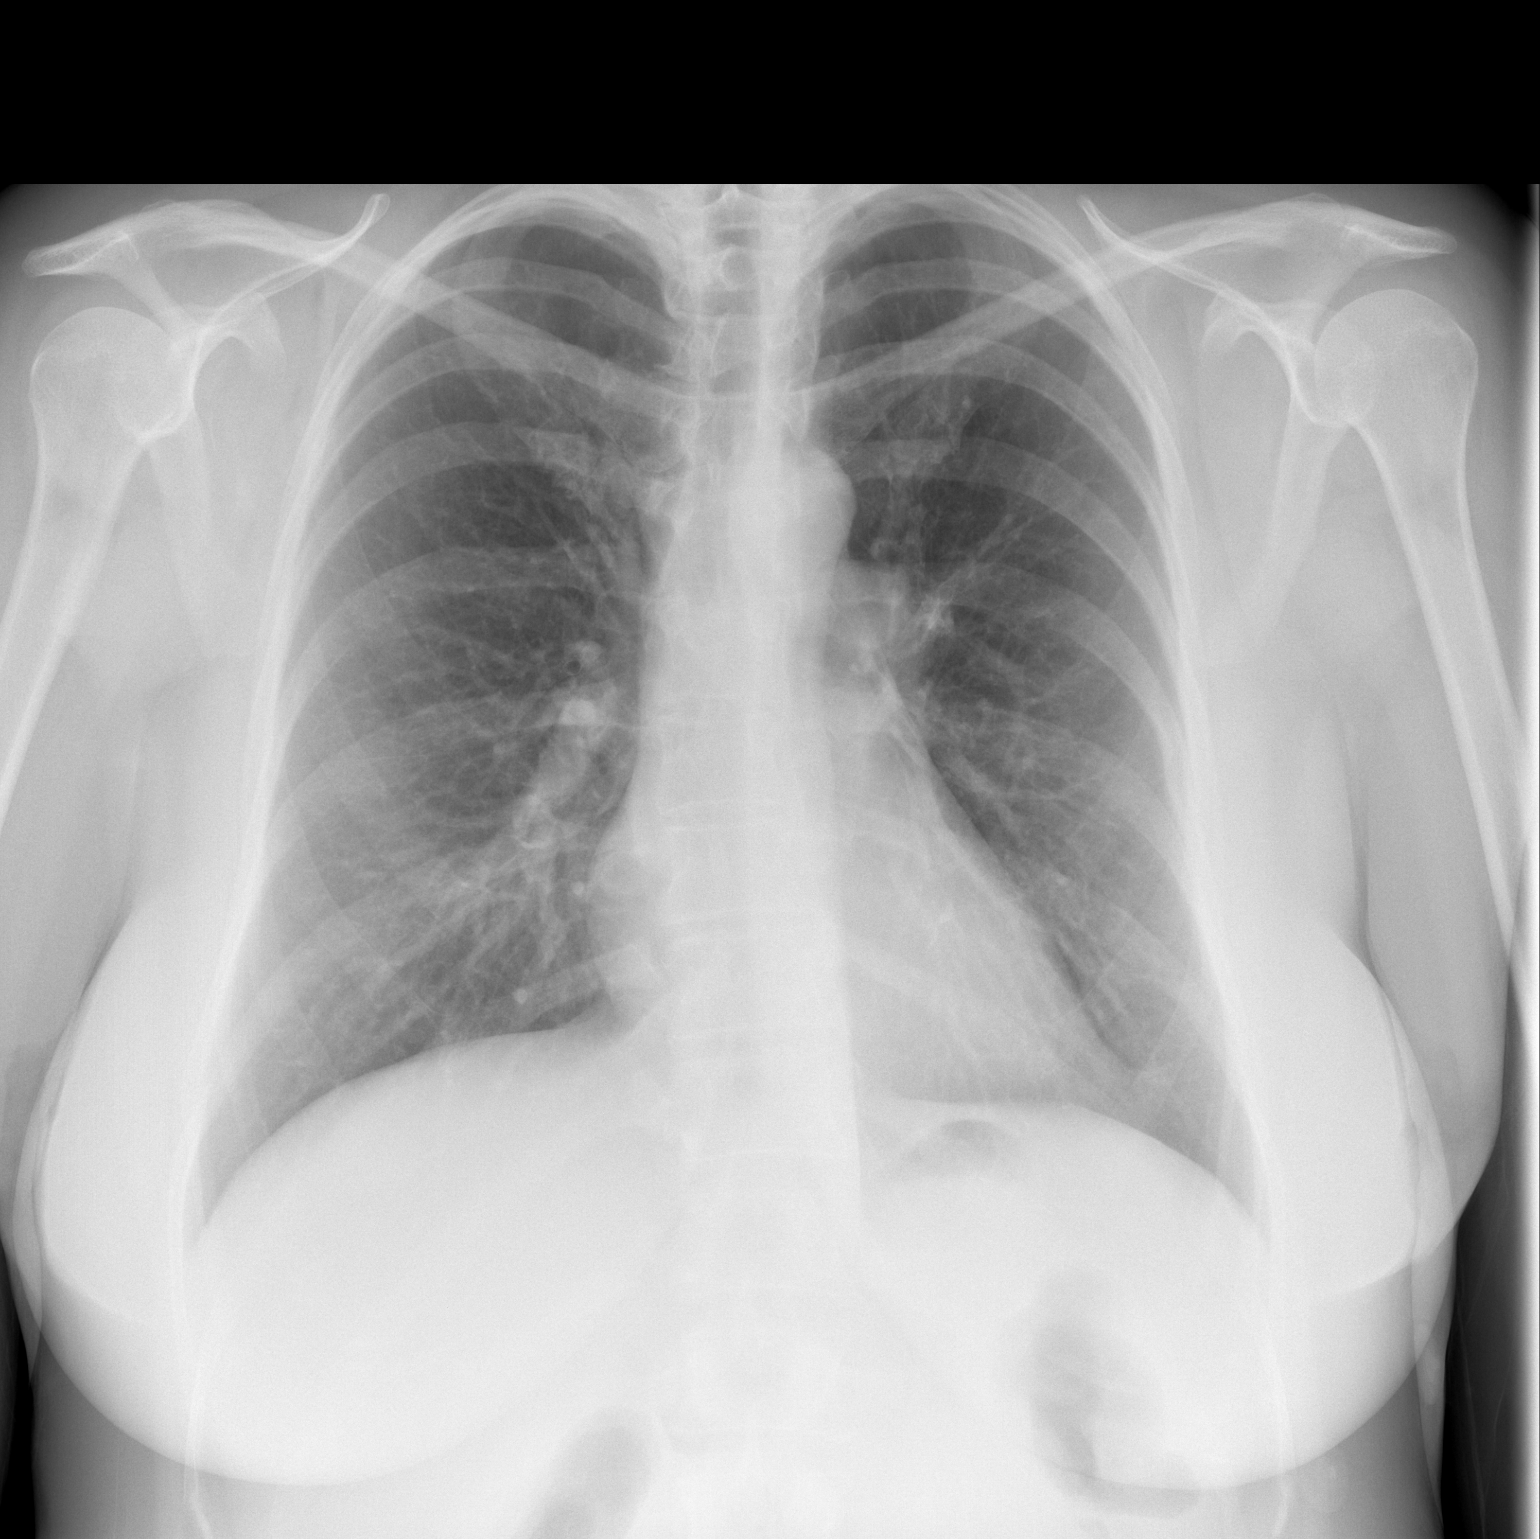

[w chest lat]
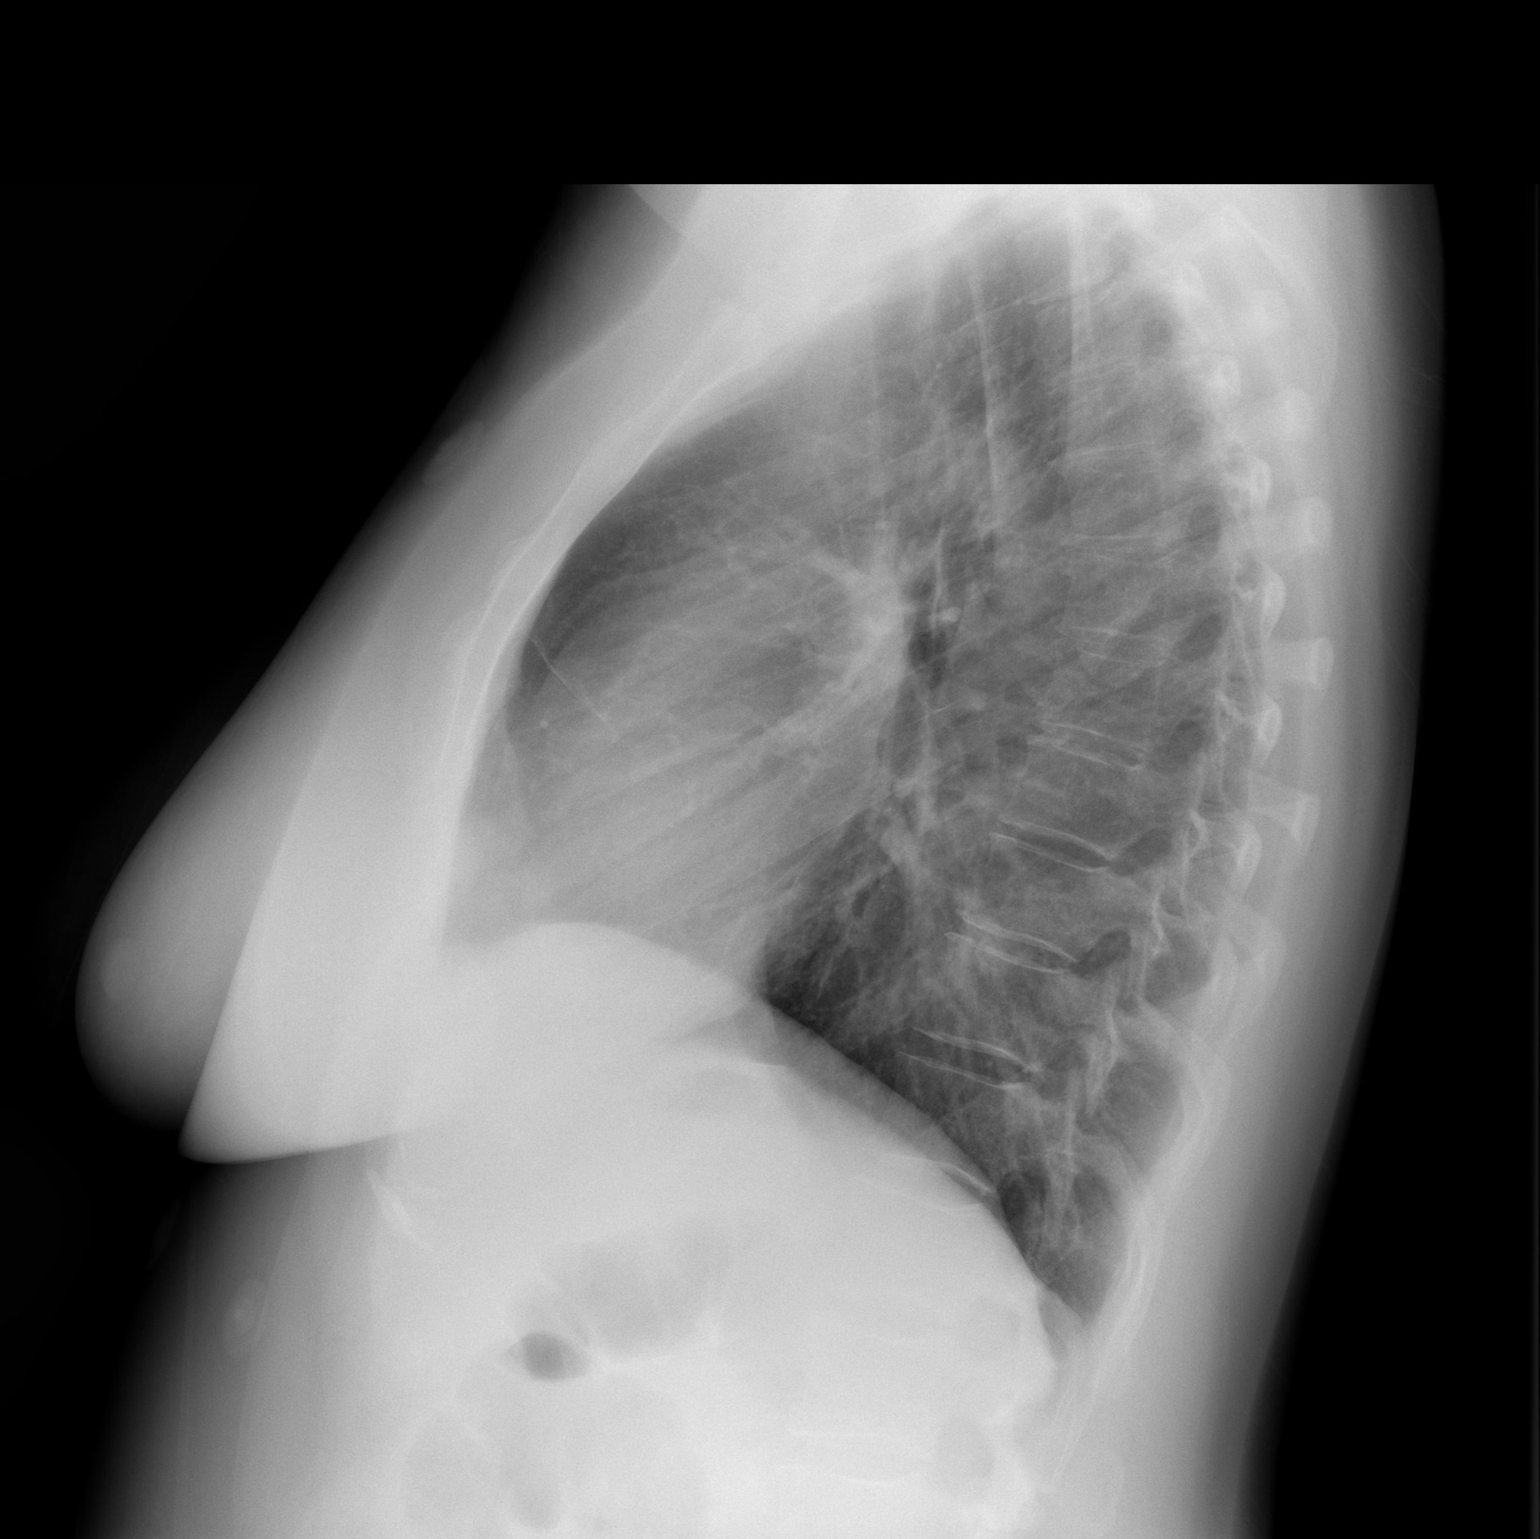

[2 of 2 positions shown; findings below may reference images not displayed]

FINDINGS: The heart size and mediastinal contours are within normal limits.
Both lungs are clear. No acute osseous abnormalities are identified.
IMPRESSION: No active cardiopulmonary disease.

## 2021-01-30 ENCOUNTER — Encounter: Payer: Self-pay | Admitting: Internal Medicine
